# Patient Record
Sex: Female | Born: 1995 | Race: White | Hispanic: No | Marital: Single | Smoking: Never smoker
Health system: Southern US, Community
[De-identification: ages and names within clinical notes are randomized; demographics above are authoritative.]

## PROBLEM LIST (undated history)

## (undated) DIAGNOSIS — T7840XA Allergy, unspecified, initial encounter: Secondary | ICD-10-CM

## (undated) HISTORY — DX: Allergy, unspecified, initial encounter: T78.40XA

---
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
## 2014-02-16 ENCOUNTER — Ambulatory Visit (INDEPENDENT_AMBULATORY_CARE_PROVIDER_SITE_OTHER): Payer: BC Managed Care – PPO

## 2014-02-16 ENCOUNTER — Ambulatory Visit (INDEPENDENT_AMBULATORY_CARE_PROVIDER_SITE_OTHER): Payer: BC Managed Care – PPO | Admitting: Family Medicine

## 2014-02-16 VITALS — BP 112/65 | HR 67 | Temp 98.1°F | Resp 20 | Ht 67.0 in | Wt 140.2 lb

## 2014-02-16 DIAGNOSIS — M25562 Pain in left knee: Secondary | ICD-10-CM

## 2014-02-16 DIAGNOSIS — T148XXA Other injury of unspecified body region, initial encounter: Secondary | ICD-10-CM

## 2014-02-16 DIAGNOSIS — T148 Other injury of unspecified body region: Secondary | ICD-10-CM

## 2014-02-16 NOTE — Patient Instructions (Signed)
Lateral Collateral Knee Ligament Sprain with Phase I Rehab The lateral collateral ligament (LCL) of the knee helps hold the knee joint in proper alignment and prevents the bones from shifting out of alignment (displacing) toward the outside (laterally). Injury to the knee may cause a tear in the LCL ligament (sprain). The LCL is the least common ligament of the knee to be injured. Sprains may heal on their own, but they often result in a loose joint. Sprains are classified into three categories. Grade 1 sprains cause pain, but the tendon is not lengthened. Grade 2 sprains include a lengthened ligament, due to the ligament being stretched or partially ruptured. With grade 2 sprains there is still function, although the function may be decreased. Grade 3 sprains involve a complete tear of the tendon or muscle, and function is usually impaired. SYMPTOMS   Pain and tenderness on the outer side of the knee.  A "pop," tearing, or pulling sensation at the time of injury.  Bruising (contusion) at the site of injury within 48 hours of injury.  Knee stiffness.  Limping, often walking with the knee bent. CAUSES  An LCL sprain occurs when a force is placed on the ligament that is greater than it can handle. Common causes of injury include:  Direct hit (trauma) to the inner side of the knee, especially if the foot is planted on the ground.  Forceful pivoting of the body and leg while the foot is planted on the ground. RISK INCREASES WITH:  Contact sports (football, rugby).  Sports that require pivoting or cutting (soccer).  Poor knee strength and flexibility.  Improper equipment use. PREVENTION   Warm up and stretch properly before activity.  Maintain physical fitness:  Strength, flexibility, and endurance.  Cardiovascular fitness.  Wear properly fitted protective equipment (correct length of cleats for surface).  Functional braces may be effective in preventing injury. PROGNOSIS  If  treated properly, LCL tears usually heal on their own. Sometimes, surgery is required. RELATED COMPLICATIONS   Frequently recurring symptoms, such as knee giving way, instability, and swelling.  Injury to other structures in the knee joint.  Meniscal cartilage, resulting in locking and swelling of the knee.  Articular cartilage, resulting in knee arthritis.  Other ligaments of the knee (commonly).  Injury to nerves, causing numbness of the outer leg, foot, and ankle and weakness or paralysis, with inability to raise the ankle, big toe, or lesser toes.  Knee stiffness (loss of knee motion). TREATMENT  Treatment first involves the use of ice and medicine to reduce pain and inflammation. The use of strengthening and stretching exercises may help reduce pain with activity. These exercises may be performed at home, but referral to a therapist is often advised. You may be advised to walk with crutches until you are able to walk without a limp. Your caregiver may provide you with a hinged knee brace to help regain a full range of motion while also protecting the injured knee. For severe LCL injuries, or injuries that involve other ligaments of the knee, surgery is often advised. MEDICATION   If pain medicine is needed, nonsteroidal anti-inflammatory medicines (aspirin and ibuprofen), or other minor pain relievers (acetaminophen), are often advised.  Do not take pain medicine for 7 days before surgery.  Prescription pain relievers may be given, if your caregiver thinks they are needed. Use only as directed and only as much as you need. HEAT AND COLD  Cold treatment (icing) should be applied for 10 to 15 minutes   every 2 to 3 hours for inflammation and pain, and immediately after activity that aggravates your symptoms. Use ice packs or an ice massage.  Heat treatment may be used before performing stretching and strengthening activities prescribed by your caregiver, physical therapist, or athletic  trainer. Use a heat pack or a warm water soak. SEEK MEDICAL CARE IF:   Symptoms get worse or do not improve in 4 to 6 weeks, despite treatment.  New, unexplained symptoms develop. (Drugs used in treatment may produce side effects.) EXERCISES RANGE OF MOTION (ROM) AND STRETCHING EXERCISES - Lateral Collateral Knee Ligament Sprain Phase I These are some of the initial exercises that your physician, physical therapist or athletic trainer may have you perform to begin your rehabilitation. When you demonstrate gains in your flexibility and strength, your caregiver may progress you to Phase II exercises. As you perform these exercises, remember:   These initial exercises are intended to be gentle. They will help you restore motion without increasing any swelling.  Completing these exercises allows less painful movement and prepares you for the more aggressive strengthening exercises in Phase II.  An effective stretch should be held for at least 30 seconds.  A stretch should never be painful. You should only feel a gentle lengthening or release in the stretched tissue. RANGE OF MOTION - Knee Flexion, Active  Lie on your back with both knees straight. (If this causes back discomfort, bend your opposite knee, placing your foot flat on the floor.)  Slowly slide your heel back toward your buttocks until you feel a gentle stretch in the front of your knee or thigh.  Hold for __________ seconds. Slowly slide your heel back to the starting position. Repeat __________ times. Complete this exercise __________ times per day.  STRETCH - Knee Flexion, Supine  Lie on the floor with your right / left heel and foot lightly touching the wall. (Place both feet on the wall, if you do not use a door frame.)  Without using any effort, allow gravity to slide your foot down the wall slowly until you feel a gentle stretch in the front of your right / left knee.  Hold this stretch for __________ seconds. Then return  the leg to the starting position, using your healthy leg for help, if needed. Repeat __________ times. Complete this stretch __________ times per day.  RANGE OF MOTION - Knee Flexion and Extension, Active-Assisted  Sit on the edge of a table or chair with your thighs firmly supported. It may be helpful to place a folded towel under the end of your right / left thigh.  Flexion (bending): Place the ankle of your healthy leg on top of the other ankle. Use your healthy leg to gently bend your right / left knee until you feel a mild tension across the top of your knee.  Hold for __________ seconds.  Extension (straightening): Switch your ankles so your right / left leg is on top. Use your healthy leg to straighten your right / left knee until you feel a mild tension on the backside of your knee.  Hold for __________ seconds. Repeat __________ times. Complete this exercise __________ times per day. STRETCH - Knee Extension Sitting  Sit with your right / left leg/heel propped on another chair, coffee table, or foot stool.  Allow your leg muscles to relax, letting gravity straighten out your knee.*  You should feel a stretch behind your right / left knee. Hold this position for __________ seconds. Repeat __________ times.   Complete this stretch __________ times per day.  *Your physician, physical therapist, or athletic trainer may instruct you place a __________ weight on your thigh, just above your kneecap, to deepen the stretch.  STRENGTHENING EXERCISES Lateral Collateral Knee Ligament Sprain - Phase I These exercises may help you when beginning to rehabilitate your injury. They may resolve your symptoms with or without further involvement from your physician, physical therapist, or athletic trainer. While completing these exercises, remember:   Muscles can gain both the endurance and the strength needed for everyday activities through controlled exercises.  Complete these exercises as  instructed by your physician, physical therapist or athletic trainer. Increase the resistance and repetitions only as guided.  In order to return to more demanding activities, you will likely need to progress to more challenging exercises. Your physician, physical therapist or athletic trainer will advance your exercises when your tissues show adequate healing and your muscles demonstrate increased strength. STRENGTH - Quadriceps, Isometrics  Lie on your back with your right / left leg extended and your opposite knee bent.  Gradually tense the muscles in the front of your right / left thigh. You should see either your kneecap slide up toward your hip or increased dimpling just above the knee. This motion will push the back of the knee down toward the floor, mat, or bed on which you are lying.  Hold the muscle as tight as you can without increasing your pain for __________ seconds.  Relax the muscles slowly and completely between each repetition. Repeat __________ times. Complete this exercise __________ times per day.  STRENGTH - Quadriceps, Short Arcs   Lie on your back. Place a __________ inch towel roll under your right / left knee, so that the knee bends slightly.  Raise only your lower leg by tightening the muscles in the front of your thigh. Do not allow your thigh to rise.  Hold this position for __________ seconds. Repeat __________ times. Complete this exercise __________ times per day.  OPTIONAL ANKLE WEIGHTS: Begin with ____________________, but DO NOT exceed ____________________. Increase in 1 pound/0.5 kilogram increments. STRENGTH - Quadriceps, Straight Leg Raises  Quality counts! Watch for signs that the quadriceps muscle is working, to be sure you are strengthening the correct muscles and not "cheating" by substituting with healthier muscles.  Lie on your back with your right / left leg extended and your opposite knee bent.  Tense the muscles in the front of your right /  left thigh. You should see either your kneecap slide up or increased dimpling just above the knee. Your thigh may even shake a bit.  Tighten these muscles even more and raise your leg 4 to 6 inches off the floor. Hold for __________ seconds.  Keeping these muscles tense, lower your leg.  Relax the muscles slowly and completely in between each repetition. Repeat __________ times. Complete this exercise __________ times per day.  STRENGTH - Hamstring, Isometrics   Lie on your back, on a firm surface.  Bend your right / left knee approximately __________ degrees.  Dig your heel into the surface as if you are trying to pull it toward your buttocks. Tighten the muscles in the back of your thighs to "dig" as hard as you can, without increasing any pain.  Hold this position for __________ seconds.  Release the tension gradually and allow your muscle to completely relax for __________ seconds in between each exercise. Repeat __________ times. Complete this exercise __________ times per day.  STRENGTH - Hamstring,   Curls   Lie on your stomach with your legs extended. (If you lie on a bed, your feet may hang over the edge.)  Tighten the muscles in the back of your thigh to bend your right / left knee up to 90 degrees. Keep your hips flat on the bed.  Hold this position for __________ seconds.  Slowly lower your leg back to the starting position. Repeat __________ times. Complete this exercise __________ times per day.  OPTIONAL ANKLE WEIGHTS: Begin with ____________________, but DO NOT exceed ____________________. Increase in 1 pound/0.5 kilogram increments. Document Released: 04/05/2005 Document Revised: 08/20/2013 Document Reviewed: 07/18/2008 [employer] Patient Information 2015 [employer]. This information is not intended to replace advice given to you by your health care provider. Make sure you discuss any questions you have with your health care provider.  

## 2014-02-16 NOTE — Progress Notes (Signed)
 Chief Complaint:  Chief Complaint  Patient presents with  . Knee Injury    left knee pain--playing sports and landed on her left knee.  small amount of swelling    HPI: Lynn Castro is a 18 y.o. female who is here for  Left knee pain starting about 2 hours ago, she fell on her left knee on grass while playing quiiditch. This is the [employer] sport that is semi contact that involves riding a broom and also chasing after  a singular main ball and there is 4 other balls that can be used to tackle the ball holder off his/her broomstick.. It is fairly rough. She has had knee problems all her life-does not know what it is called. She does not know if she has irregular  q angle or patellofemoral syndrome.  She has pain when she extends her leg past a certain point, it is only on the lateral side of the left knee, denies any bruising, popping. She has swelling.Denies weakness,numbness, tingling  Past Medical History  Diagnosis Date  . Allergy    History reviewed. No pertinent past surgical history. History   Social History  . Marital Status: Single    Spouse Name: N/A    Number of Children: N/A  . Years of Education: N/A   Social History Main Topics  . Smoking status: Never Smoker   . Smokeless tobacco: None  . Alcohol Use: No  . Drug Use: No  . Sexual Activity: None   Other Topics Concern  . None   Social History Narrative  . None   Family History  Problem Relation Age of Onset  . Hyperlipidemia Father   . Hypertension Father    Allergies  Allergen Reactions  . Erythromycin   . Latex   . Zithromax [Azithromycin]    Prior to Admission medications   Medication Sig Start Date End Date Taking? Authorizing Provider  Multiple Vitamin (MULTIVITAMIN) capsule Take 1 capsule by mouth daily.   Yes Historical Provider, MD  naproxen (NAPROSYN) 250 MG tablet Take 250 mg by mouth 2 (two) times daily with a meal.   Yes Historical Provider, MD     ROS: The patient denies  fevers, chills, night sweats, unintentional weight loss, chest pain, palpitations, wheezing, dyspnea on exertion, nausea, vomiting, abdominal pain, dysuria, hematuria, melena, numbness, weakness, or tingling.  All other systems have been reviewed and were otherwise negative with the exception of those mentioned in the HPI and as above.    PHYSICAL EXAM: Filed Vitals:   02/16/14 1423  BP: 112/65  Pulse: 67  Temp: 98.1 F (36.7 C)  Resp: 20   Filed Vitals:   02/16/14 1423  Height: 5\' 7"  (1.702 m)  Weight: 140 lb 3.2 oz (63.594 kg)   Body mass index is 21.95 kg/(m^2).  General: Alert, no acute distress HEENT:  Normocephalic, atraumatic, oropharynx patent. EOMI, PERRLA Cardiovascular:  Regular rate and rhythm, no rubs murmurs or gallops.  No Carotid bruits, radial pulse intact. No pedal edema.  Respiratory: Clear to auscultation bilaterally.  No wheezes, rales, or rhonchi.  No cyanosis, no use of accessory musculature GI: No organomegaly, abdomen is soft and non-tender, positive bowel sounds.  No masses. Skin: No rashes. Neurologic: Facial musculature symmetric. Psychiatric: Patient is appropriate throughout our interaction. Lymphatic: No cervical lymphadenopathy Musculoskeletal: Gait antalgic . She has minimal knee swelling + LCL tenderness on palpation Stable to varus to valgus stress No jt line tenderness, McMurphy neg Lachman neg  Full ROM 5/5 strength, 2/2 DTRs Straight leg negative   LABS: No results found for this or any previous visit. Neg for fracture or dislocation   EKG/XRAY:   Primary [facility]. Neg for fx or dislocation   ASSESSMENT/PLAN: Encounter Diagnoses  Name Primary?  . Left knee pain   . Sprain and strain Yes   LCL strain vs less likely meniscus vs less likely CL/PCL issues vs less likely  IT band syndrome She will use the knee brace she already has She will take ibuprofen/tylenol prn She will cont with ROM, I have  asked her to try to strengthen her quadriceps  F/u prn   Gross sideeffects, risk and benefits, and alternatives of medications d/w patient. Patient is aware that all medications have potential sideeffects and we are unable to predict every sideeffect or drug-drug interaction that may occur.  ,  PHUONG, DO 02/16/2014 5:10 PM

## 2015-07-26 IMAGING — CR DG KNEE COMPLETE 4+V*L*
4 series · 4 of 4 positions shown · non-contrast
Comparison: None.

CLINICAL DATA: Left knee pain following exercise

EXAM:
LEFT KNEE - COMPLETE 4+ VIEW

[AP]
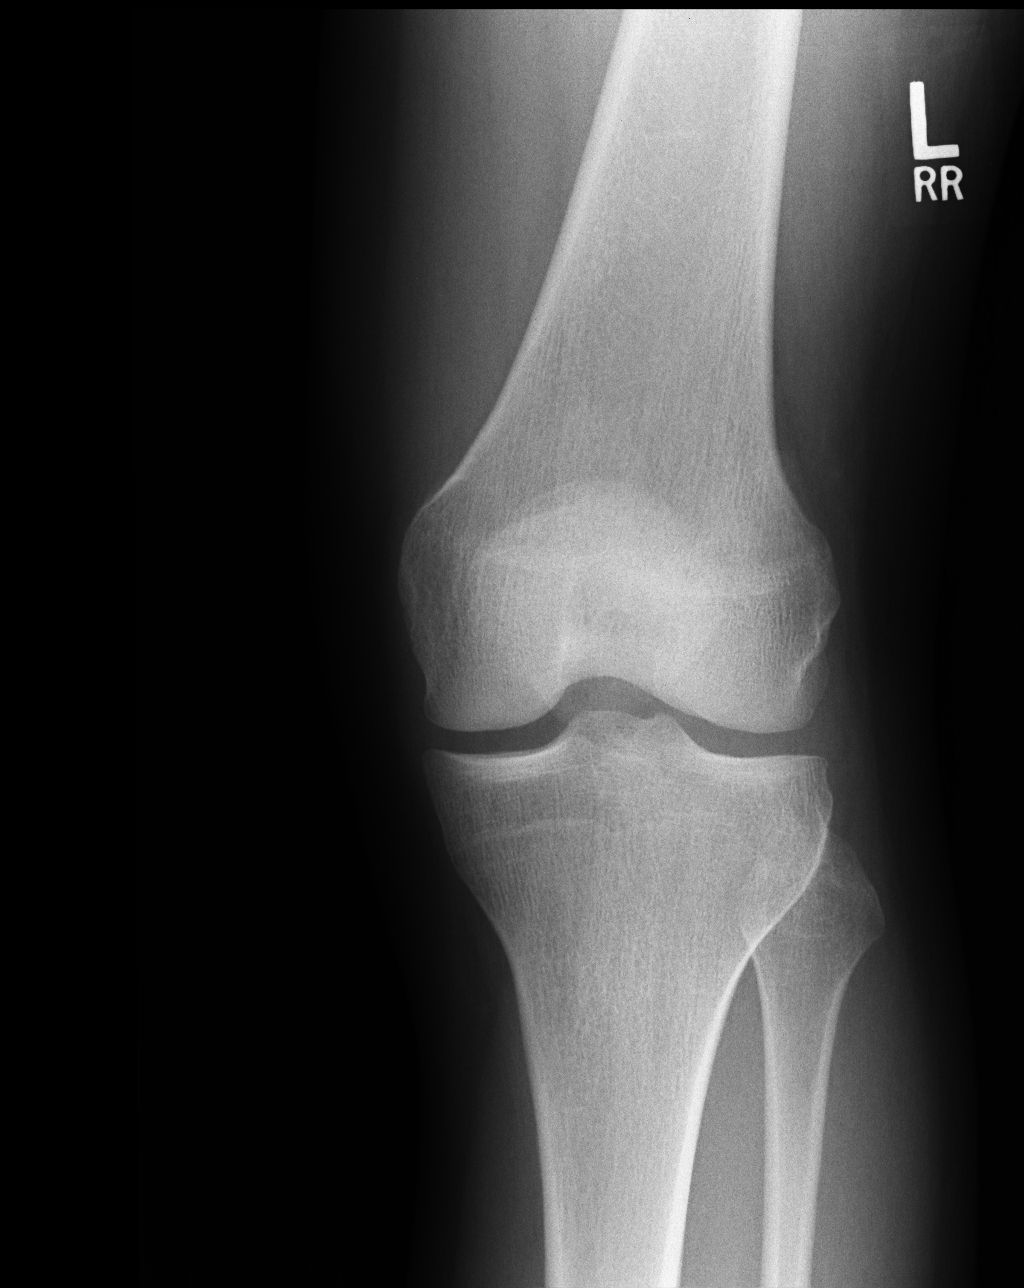

[lateral]
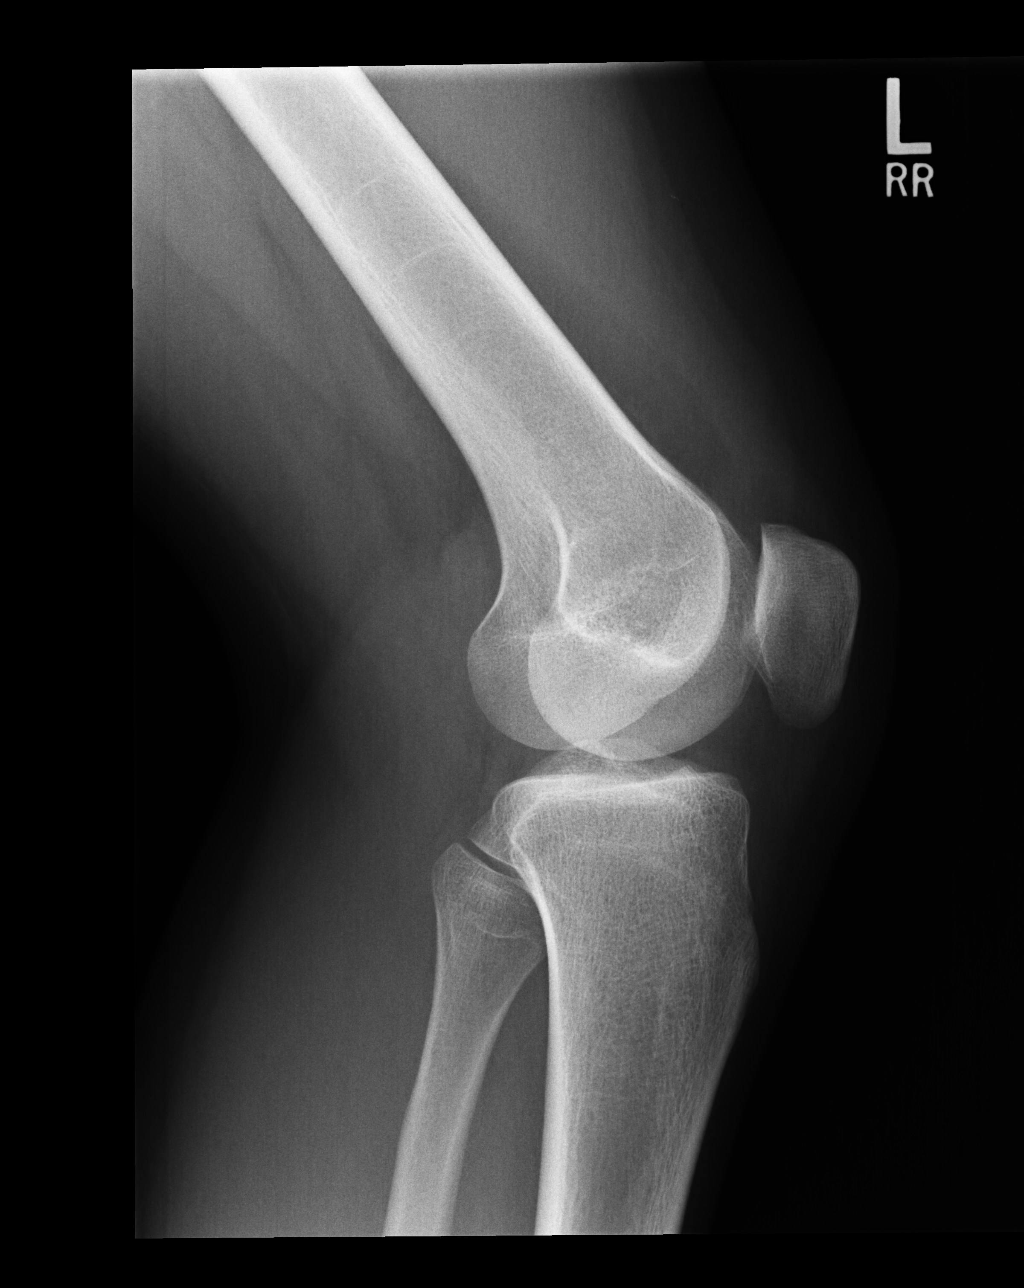

[ap axial]
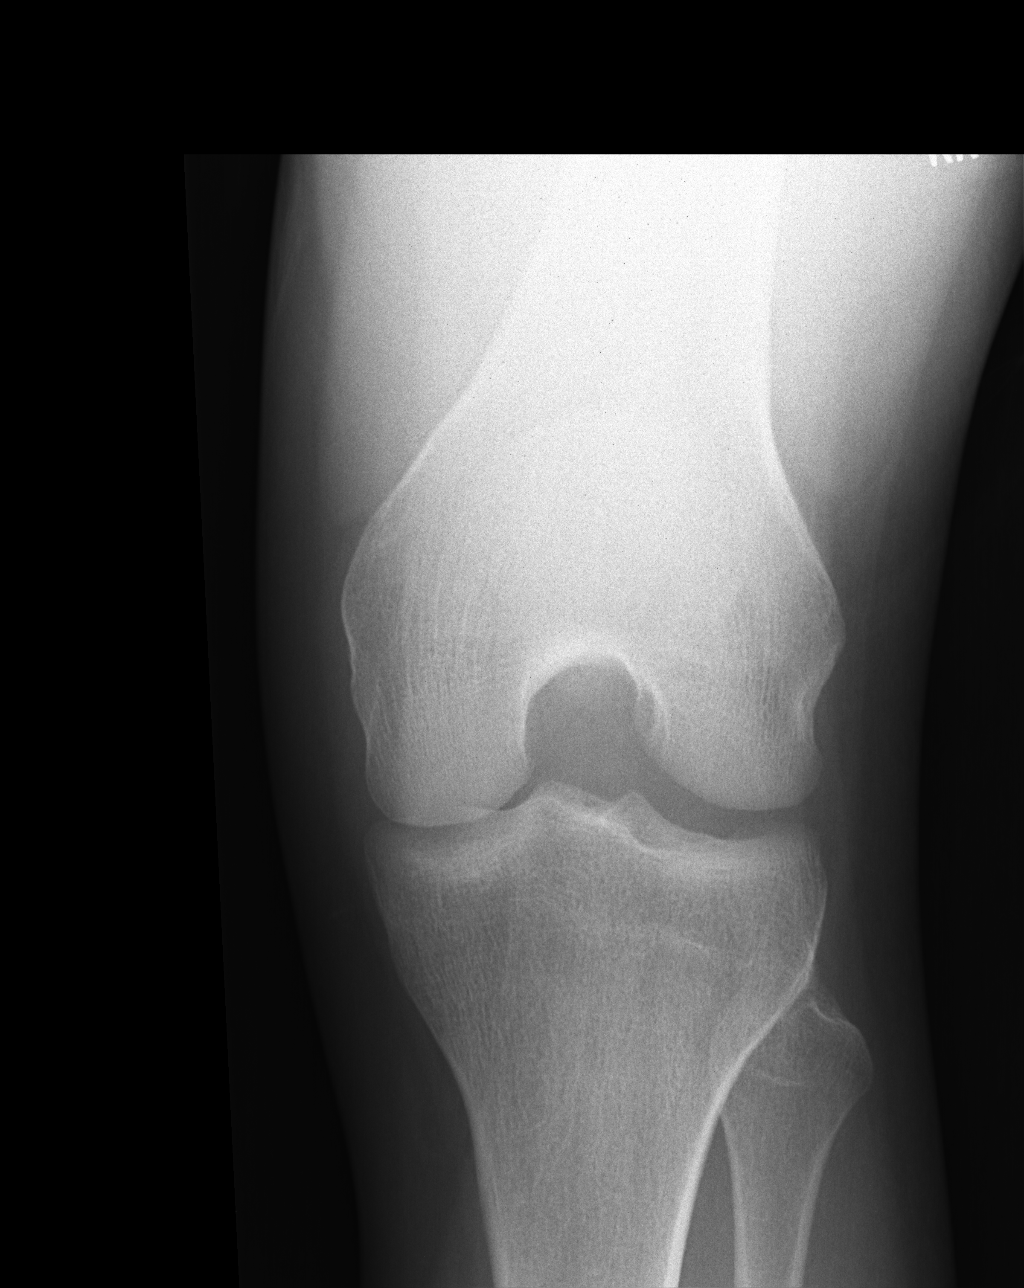

[sunrise]
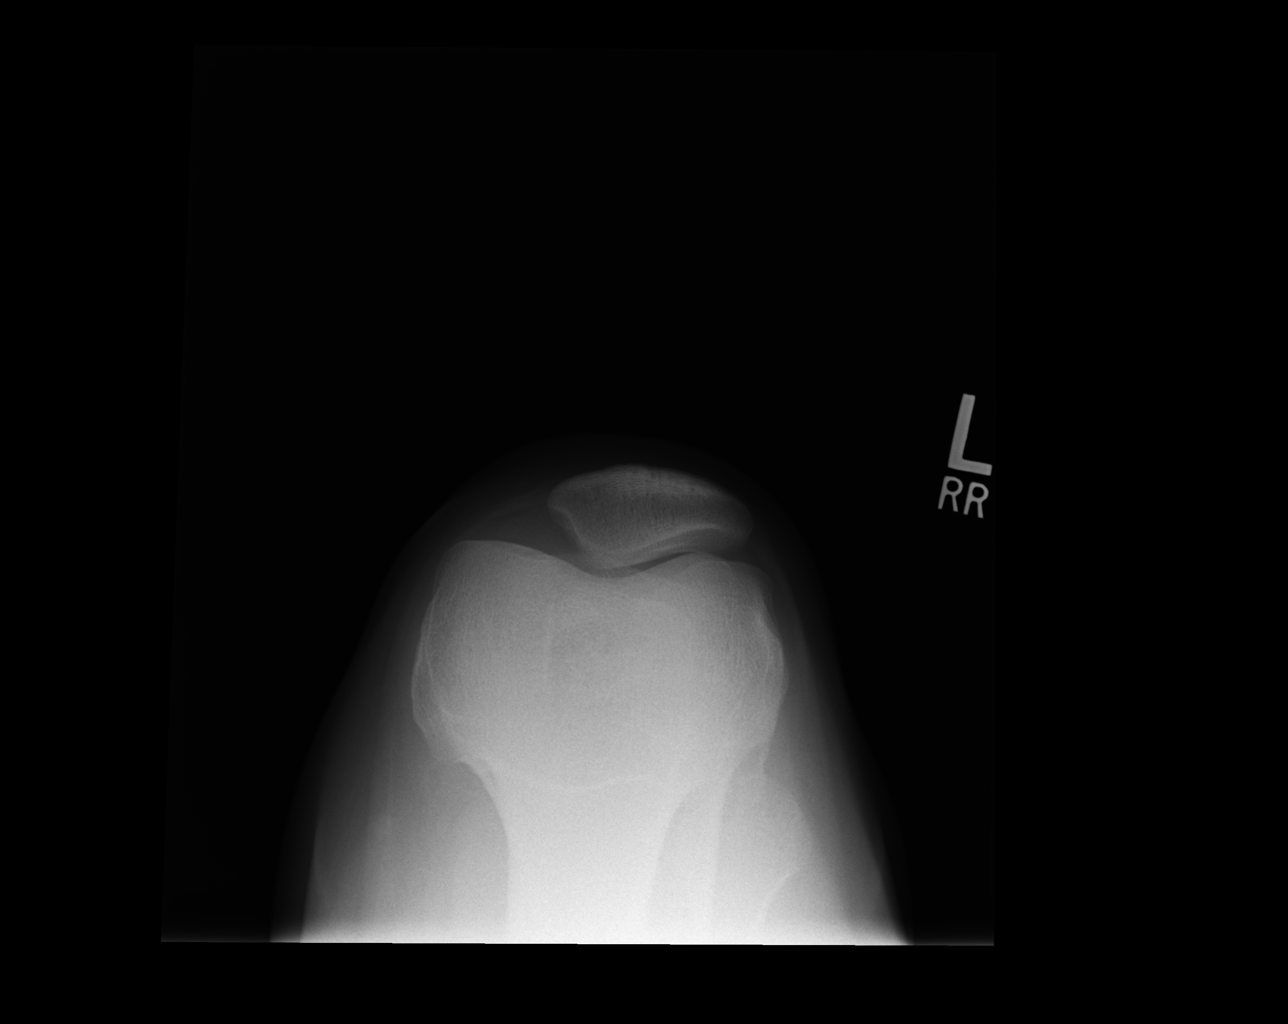

[4 of 4 positions shown; findings below may reference images not displayed]

FINDINGS: There is no evidence of fracture, dislocation, or joint effusion.
There is no evidence of arthropathy or other focal bone abnormality.
Soft tissues are unremarkable.
IMPRESSION: No acute abnormality noted.
